# Patient Record
Sex: Female | Born: 1955 | Race: White | Hispanic: No | Marital: Single | Smoking: Never smoker
Health system: Southern US, Community
[De-identification: ages and names within clinical notes are randomized; demographics above are authoritative.]

## PROBLEM LIST (undated history)

## (undated) DIAGNOSIS — B019 Varicella without complication: Secondary | ICD-10-CM

## (undated) HISTORY — PX: FRACTURE SURGERY: SHX138

## (undated) HISTORY — DX: Varicella without complication: B01.9

---

## 2012-06-10 ENCOUNTER — Encounter (HOSPITAL_COMMUNITY): Payer: Self-pay

## 2012-06-10 ENCOUNTER — Emergency Department (INDEPENDENT_AMBULATORY_CARE_PROVIDER_SITE_OTHER)
Admission: EM | Admit: 2012-06-10 | Discharge: 2012-06-10 | Disposition: A | Discharge status: Home / Self Care | Payer: Self-pay | Source: Home / Self Care

## 2012-06-10 DIAGNOSIS — K029 Dental caries, unspecified: Secondary | ICD-10-CM

## 2012-06-10 MED ORDER — CETYLPYRIDINIUM CHLORIDE 0.05 % MT LIQD: 5.0000 mL | Freq: Four times a day (QID) | OROMUCOSAL | Status: DC

## 2012-06-10 MED ORDER — IBUPROFEN 200 MG PO TABS: 400.0000 mg | ORAL_TABLET | Freq: Four times a day (QID) | ORAL | Status: DC | PRN

## 2012-06-10 NOTE — ED Provider Notes (Signed)
History     CSN: [id number]  Arrival date & time 06/10/12  1014   First MD Initiated Contact with Patient 06/10/12 1159      Chief Complaint  Patient presents with  . 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 She informs the pain to be off and on for past 2 days. She denies any fever or chills. She denies radiation of the pain to the jaw or to the ear. She denies any gum bleeding. She denies any headache, chest pain, palpitation, shortness of breath, abdominal pain, nausea or vomiting. Denies bowel or urinary symptoms. Has not taken any medication for the pain.  History reviewed. No pertinent past medical history.  History reviewed. No pertinent past surgical history.  No family history on file.  History  Substance Use Topics  . Smoking status: Never Smoker   . Smokeless tobacco: Not on file  . Alcohol Use: No    OB History    Grav Para Term Preterm Abortions TAB SAB Ect Mult Living                  Review of Systems As outlined in history of present illness Allergies  Review of patient's allergies indicates no known allergies.  Home Medications  No current outpatient prescriptions on file.  BP 118/83  Pulse 60  Temp 97.5 F (36.4 C) (Oral)  Resp 19  SpO2 99%  Physical Exam Middle-aged female in no distress HEENT: No pallor, moist oral mucosa, Katie the right upper molar tooth, no gum swelling or abscess formation. No jaw tenderness. Chest: Clear to status bilaterally CVS: Normal S1 and S2 no murmurs Abdomen: Soft, nontender Extremities: Warm, no edema ED Course  Procedures (including critical care time)  Labs Reviewed - No data to display No results found.   No diagnosis found.  Dental caries Symptom limited to right upper molar tooth without any abscess or spread of the infection. I will provide her with chlorhexidine oral rinse and also advised her rinse her mouth with  lukewarm Water and salt 3-4 times a day. -Will prescribe her with some Advil for pain. -Counseled her on using fluoridated toothpaste and avoiding sugary foods. If symptoms unimproved or worsen she needs to see a dentist.    MDM  Follow up as needed        Eddie North, MD 06/10/12 1230

## 2012-06-10 NOTE — ED Notes (Signed)
C/o intermitent pain to a tooth on the upper right side of mouth

## 2013-04-30 ENCOUNTER — Encounter (HOSPITAL_COMMUNITY): Payer: Self-pay | Admitting: Emergency Medicine

## 2013-04-30 ENCOUNTER — Emergency Department (HOSPITAL_COMMUNITY): Payer: Self-pay

## 2013-04-30 ENCOUNTER — Emergency Department (HOSPITAL_COMMUNITY)
Admission: EM | Admit: 2013-04-30 | Discharge: 2013-04-30 | Disposition: A | Discharge status: Home / Self Care | Payer: Self-pay | Attending: Emergency Medicine | Admitting: Emergency Medicine

## 2013-04-30 DIAGNOSIS — S0083XA Contusion of other part of head, initial encounter: Secondary | ICD-10-CM

## 2013-04-30 DIAGNOSIS — Y9389 Activity, other specified: Secondary | ICD-10-CM | POA: Insufficient documentation

## 2013-04-30 DIAGNOSIS — S0003XA Contusion of scalp, initial encounter: Secondary | ICD-10-CM | POA: Insufficient documentation

## 2013-04-30 DIAGNOSIS — Y92410 Unspecified street and highway as the place of occurrence of the external cause: Secondary | ICD-10-CM | POA: Insufficient documentation

## 2013-04-30 DIAGNOSIS — S82899A Other fracture of unspecified lower leg, initial encounter for closed fracture: Secondary | ICD-10-CM | POA: Insufficient documentation

## 2013-04-30 DIAGNOSIS — S82839A Other fracture of upper and lower end of unspecified fibula, initial encounter for closed fracture: Secondary | ICD-10-CM

## 2013-04-30 DIAGNOSIS — Z7982 Long term (current) use of aspirin: Secondary | ICD-10-CM | POA: Insufficient documentation

## 2013-04-30 MED ORDER — HYDROCODONE-ACETAMINOPHEN 5-325 MG PO TABS: 1.0000 | ORAL_TABLET | Freq: Four times a day (QID) | ORAL | Status: DC | PRN

## 2013-04-30 MED ORDER — IBUPROFEN 800 MG PO TABS
800.0000 mg | ORAL_TABLET | Freq: Once | ORAL | Status: AC
  Administered 2013-04-30: 800 mg via ORAL
  Filled 2013-04-30: qty 1

## 2013-04-30 NOTE — ED Notes (Signed)
Ortho tech, Jonny Ruiz, notified of orders
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
## 2013-04-30 NOTE — ED Notes (Signed)
Patient observed ambulating to restroom without assistance.

## 2013-04-30 NOTE — ED Notes (Signed)
Ortho tech at bedside 
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
## 2013-05-05 NOTE — ED Provider Notes (Signed)
Medical screening examination/treatment/procedure(s) were performed by non-physician practitioner and as supervising physician I was immediately available for consultation/collaboration.  EKG Interpretation   None        Raeford Razor, MD 05/05/13 [phone]

## 2015-11-20 ENCOUNTER — Telehealth: Payer: Self-pay | Admitting: *Deleted

## 2015-11-20 NOTE — Telephone Encounter (Signed)
Unable to reach patient at time of pre-visit call. Phone # on file has been disconnected or is no longer in service.

## 2015-11-21 ENCOUNTER — Telehealth: Payer: Self-pay | Admitting: Family Medicine

## 2015-11-21 ENCOUNTER — Ambulatory Visit: Payer: Self-pay | Admitting: Family Medicine

## 2015-11-23 ENCOUNTER — Encounter: Payer: Self-pay | Admitting: Family Medicine

## 2015-11-23 NOTE — Telephone Encounter (Signed)
Pt was no show 11/21/15 for new pt appt, 1st no show, no cancellations prior, charge or no charge? Reschedule with you if pt calls?

## 2015-11-23 NOTE — Telephone Encounter (Signed)
No charge but send letter please

## 2015-11-23 NOTE — Telephone Encounter (Signed)
Waiving fee & sending reminder letter

## 2016-01-04 ENCOUNTER — Telehealth: Payer: Self-pay

## 2016-01-04 NOTE — Telephone Encounter (Signed)
Phone number not valid

## 2016-01-07 ENCOUNTER — Encounter: Payer: Self-pay | Admitting: Family Medicine

## 2016-01-07 ENCOUNTER — Ambulatory Visit (INDEPENDENT_AMBULATORY_CARE_PROVIDER_SITE_OTHER): Payer: Self-pay | Admitting: Family Medicine

## 2016-01-07 VITALS — BP 87/71 | HR 61 | Temp 97.7°F | Ht 67.5 in | Wt 121.6 lb

## 2016-01-07 DIAGNOSIS — Z131 Encounter for screening for diabetes mellitus: Secondary | ICD-10-CM

## 2016-01-07 DIAGNOSIS — Z1329 Encounter for screening for other suspected endocrine disorder: Secondary | ICD-10-CM

## 2016-01-07 DIAGNOSIS — Z13220 Encounter for screening for lipoid disorders: Secondary | ICD-10-CM

## 2016-01-07 DIAGNOSIS — F320 Major depressive disorder, single episode, mild: Secondary | ICD-10-CM

## 2016-01-07 DIAGNOSIS — H6123 Impacted cerumen, bilateral: Secondary | ICD-10-CM

## 2016-01-07 DIAGNOSIS — Z130 Encounter for screening for diseases of the blood and blood-forming organs and certain disorders involving the immune mechanism: Secondary | ICD-10-CM

## 2016-01-07 MED ORDER — BUPROPION HCL ER (SR) 150 MG PO TB12
150.0000 mg | ORAL_TABLET | Freq: Two times a day (BID) | ORAL | Status: DC
Start: 2016-01-07 — End: 2022-05-12

## 2016-01-07 NOTE — Progress Notes (Signed)
[facility] [address], [address] [place], [place] [address] [phone] [phone]  Date:  01/07/2016   Name:  Denise PinkMary L Shannon   DOB:  06/01/1956   MRN:  [id number]  PCP:  Default, Provider, MD    Chief Complaint: Establish Care   History of Present Illness:  Denise Shannon is a 60 y.o. very pleasant female patient who presents with the following:  Here today as a new patient to discuss anxiety. She is a new patient to us today. Her mother passed away this spring which has been tough for her.  She states that she is "still thinking about her" more than she thinks is normal.  She still feels sad a lot of the time.  She would be interested in trying medication for depression  She is generally in good health.   She would like to have a general exam and labs as well.  She has noted some difficulty with orgasm as of late.  Her partner has complained about this, and she wonders if anything else could be done to help her with this.   She has been menopausal for a few years.  She has minimal dryness and vaginal discomfort  She is sleeping well, her appetite is good. She has not lost weight. Noted that her BP is low- she does not really see doctors and does not know if this is baseline for her. However she has not noted any sx of lightheadedness or dizziness     No SI Notes that her ears feel clogged, she wonders if she may have excessive ear wax  There are no active problems to display for this patient.   Past Medical History  Diagnosis Date  . Chicken pox     Past Surgical History  Procedure Laterality Date  . Fracture surgery Left     hand    Social History  Substance Use Topics  . Smoking status: Never Smoker   . Smokeless tobacco: Not on file  . Alcohol Use: No    Family History  Problem Relation Age of Onset  . Lung cancer Mother   . Prostate cancer Father   . Stroke Mother     No Known Allergies  Medication list has  been reviewed and updated.  Current Outpatient Prescriptions on File Prior to Visit  Medication Sig Dispense Refill  . aspirin 325 MG tablet Take 325 mg by mouth as needed. Reported on 01/07/2016     No current facility-administered medications on file prior to visit.    Review of Systems:  As per HPI- otherwise negative.   Physical Examination: Filed Vitals:   01/07/16 1427  BP: 87/71  Pulse: 61  Temp: 97.7 F (36.5 C)   Filed Vitals:   01/07/16 1427  Height: 5' 7.5" (1.715 m)  Weight: 121 lb 9.6 oz (55.157 kg)   Body mass index is 18.75 kg/(m^2). Ideal Body Weight: Weight in (lb) to have BMI = 25: 161.7  GEN: WDWN, NAD, Non-toxic, A & O x 3, thin build, looks well HEENT: Atraumatic, Normocephalic. Neck supple. No masses, No LAD.  PEERL, EOMI Ears and Nose: No external deformity. CV: RRR, No M/G/R. No JVD. No thrill. No extra heart sounds. PULM: CTA B, no wheezes, crackles, rhonchi. No retractions. No resp. distress. No accessory muscle use. ABD: S, NT, ND EXTR: No c/c/e NEURO Normal gait.  PSYCH: Normally interactive. Conversant. Not depressed or anxious appearing.  Calm  demeanor.  Bilateral cerumen impaction. Worked on this and removed most of the wax- stopped as she was feeling dizzy   Assessment and Plan: Depression, major, single episode, mild (HCC) - Plan: buPROPion (WELLBUTRIN SR) 150 MG 12 hr tablet  Screening for hyperlipidemia - Plan: Lipid panel  Screening for thyroid disorder - Plan: CMP14+CBC/D/Plt+TSH  Screening for diabetes mellitus - Plan: CMP14+CBC/D/Plt+TSH  Screening for deficiency anemia - Plan: CMP14+CBC/D/Plt+TSH  Cerumen impaction, bilateral  Start on wellbutrin for depression- 150 for one week, then BID Labs pending as above Earwax removed with irrigation She will see me in 3 months for a CPE- will recheck her BP them.  Suspect hypotension is physiologic   Signed Abbe Amsterdam, MD

## 2016-01-07 NOTE — Patient Instructions (Addendum)
It was good to see you today- I will be in touch with your labs asap Start on the wellbutrin once a day, and increase to twice a day after 1 week.   Please see Denise Shannon in 3 months or so for a physical   You might try some OTC ear wax removing drops like Debrox to help keep your earwax soft so it does not get stuck in your eat

## 2016-01-07 NOTE — Progress Notes (Signed)
Pre visit review using our clinic review tool, if applicable. No additional management support is needed unless otherwise documented below in the visit note. 

## 2016-01-08 LAB — LIPID PANEL
Cholesterol: 222 mg/dL — ABNORMAL HIGH (ref 0–200)
HDL: 76.1 mg/dL (ref >39.00)
LDL Cholesterol: 132 mg/dL — ABNORMAL HIGH (ref 0–99)
NONHDL: 145.58
Total CHOL/HDL Ratio: 3
Triglycerides: 68 mg/dL (ref 0.0–149.0)
VLDL: 13.6 mg/dL (ref 0.0–40.0)

## 2016-01-09 ENCOUNTER — Encounter: Payer: Self-pay | Admitting: Family Medicine

## 2016-01-09 LAB — CBC AND DIFFERENTIAL
HEMATOCRIT: 41 % (ref 36–46)
HEMOGLOBIN: 13.4 g/dL (ref 12.0–16.0)
Platelets: 293 10*3/uL (ref 150–399)
WBC: 8.6 10^3/mL

## 2016-01-09 LAB — HEPATIC FUNCTION PANEL
ALK PHOS: 76 U/L (ref 25–125)
ALT: 9 U/L (ref 7–35)
AST: 14 U/L (ref 13–35)
BILIRUBIN, TOTAL: 0.7 mg/dL

## 2016-01-09 LAB — TSH: TSH: 0.74 u[IU]/mL (ref 0.41–5.90)

## 2016-01-09 LAB — BASIC METABOLIC PANEL
BUN: 15 mg/dL (ref 4–21)
Creatinine: 0.8 mg/dL (ref 0.5–1.1)
Glucose: 90 mg/dL
Potassium: 4.2 mmol/L (ref 3.4–5.3)
SODIUM: 142 mmol/L (ref 137–147)

## 2016-01-10 ENCOUNTER — Other Ambulatory Visit: Payer: Self-pay

## 2016-01-16 ENCOUNTER — Encounter: Payer: Self-pay | Admitting: Family Medicine

## 2017-09-14 ENCOUNTER — Encounter: Payer: Self-pay | Admitting: Family Medicine

## 2017-09-14 ENCOUNTER — Ambulatory Visit (INDEPENDENT_AMBULATORY_CARE_PROVIDER_SITE_OTHER): Payer: Self-pay | Admitting: Family Medicine

## 2017-09-14 VITALS — BP 110/78 | HR 62 | Temp 98.0°F | Ht 67.5 in | Wt 136.1 lb

## 2017-09-14 DIAGNOSIS — R040 Epistaxis: Secondary | ICD-10-CM

## 2017-09-14 NOTE — Progress Notes (Signed)
Pre visit review using our clinic review tool, if applicable. No additional management support is needed unless otherwise documented below in the visit note. 

## 2017-09-14 NOTE — Progress Notes (Signed)
Chief Complaint  Patient presents with  . Epistaxis    Subjective: Patient is a 62 y.o. female here for nosebleeds.  Over past mo, has been having more nosebleeds on R side. Usually after sneezing, lasting around 8 minutes. Intermittent use of air humidifier.  She sometimes picks her nose.  No personal or family history of bleeding disorders, no other areas of easy bruising or bleeding.  Although aspirin is on her medication list, she has not taken it recently and only uses as needed for headaches.  She has not tried anything else.   ROS: HEENT: +nosebleeds   Past Medical History:  Diagnosis Date  . Chicken pox    Objective: BP 110/78 (BP Location: Left Arm, Patient Position: Sitting, Cuff Size: Normal)   Pulse 62   Temp 98 F (36.7 C) (Oral)   Ht 5' 7.5" (1.715 m)   Wt 136 lb 2 oz (61.7 kg)   SpO2 98%   BMI 21.01 kg/m  General: Awake, appears stated age HEENT: MMM, L nare neg, R nare patent, excoriation noted over septum without active oozing Lungs: No accessory muscle use Psych: Normal affect and mood  Assessment and Plan: Epistaxis  Apply triple antibiotic ointment (like Neosporin) twice daily on the inside of the nose. You can use Vaseline in between applications of the antibiotic ointment. Run the air humidifier at night and while you are home. The patient voiced understanding and agreement to the plan.  Jilda Rocheicholas Paul VikingWendling, DO 09/14/17  12:24 PM

## 2017-09-14 NOTE — Patient Instructions (Addendum)
Apply triple antibiotic ointment (like Neosporin) twice daily on the inside of the nose. You have to go a little higher. You can use Vaseline in between applications of the antibiotic ointment.  Run the air humidifier at night and while you are home.  Try not to pick. Nosebleed A nosebleed is when blood comes out of the nose. Nosebleeds are common. They are usually not a sign of a serious medical problem. Follow these instructions at home: When you have a nosebleed:  Sit down.  Tilt your head a little forward.  Follow these steps: 1. Pinch your nose with a clean towel or tissue. 2. Keep pinching your nose for 10 minutes. Do not let go. 3. After 10 minutes, let go of your nose. 4. If there is still bleeding, do these steps again. Keep doing these steps until the bleeding stops.  Do not put things in your nose to stop the bleeding.  Try not to lie down or put your head back.  Use a nose spray decongestant as told by your doctor.  Do not use petroleum jelly or mineral oil in your nose. These things can get into your lungs. After a nosebleed:  Try not to blow your nose or sniffle for several hours.  Try not to strain, lift, or bend at the waist for several days.  Use saline spray or a humidifier as told by your doctor.  Aspirin and blood-thinning medicines make bleeding more likely. If you take these medicines, ask your doctor if you should stop taking them, or if you should change how much you take. Do not stop taking the medicine unless your doctor tells you to. Contact a doctor if:  You have a fever.  You get nosebleeds often.  You are getting nosebleeds more often than usual.  You bruise very easily.  You have something stuck in your nose.  You have bleeding in your mouth.  You throw up (vomit) or cough up brown material.  You get a nosebleed after you start a new medicine. Get help right away if:  You have a nosebleed after you fall or hurt your head.  Your  nosebleed does not go away after 20 minutes.  You feel dizzy or weak.  You have unusual bleeding from other parts of your body.  You have unusual bruising on other parts of your body.  You get sweaty.  You throw up blood. Summary  Nosebleeds are common. They are usually not a sign of a serious medical problem.  When you have a nosebleed, sit down and tilt your head a little forward. Pinch your nose with a clean tissue.  After the bleeding stops, try not to blow your nose or sniffle for several hours. This information is not intended to replace advice given to you by your health care provider. Make sure you discuss any questions you have with your health care provider. Document Released: 03/25/2008 Document Revised: 09/26/2016 Document Reviewed: 09/26/2016 Elsevier Interactive Patient Education  2018 [employer]

## 2018-04-10 NOTE — Progress Notes (Addendum)
[facility] at [facility] [address] [place], [place] [address] [phone]  Date:  04/14/2018   Name:  Denise Shannon   DOB:  April 07, 1956   MRN:  [id number]  PCP:  Default, Provider, MD    Chief Complaint: Bloated (4-6 months,  lbm yestrday, no blood in stool) and Shoulder Pain (3 months, right shoulder, no known injury, trouble lifting)   History of Present Illness:  Denise Shannon is a 62 y.o. very pleasant female patient who presents with the following:  Last seen by myself as a new patient in July of 2017, but was in to see Dr. Carmelia Roller earlier this year due to nosebleeds.  She did not come back to see me for CPE in 2017 as we had discussed  Here today with concern of bloating in her lower abd and right shoulder pain   She has had noted right shoulder pain for 3 months or so She is not aware of any injury  Her shoulder has seemed about the same for the last several months.  She does have difficulty with her ROM  She will have some off and on pain Lifting makes it hurt more She has not tried any medication for her shoulder as of yet  Also, she has noted a feeling of fullness/ bloating in her lower abd for 3 months or so She has been constipated "on and off" Her pants don't seem to fit quite like they should  She is not aware of any personal or family GYN cancer  Never had a colonoscopybl Last pap was about 3 years ago- at least   Wt Readings from Last 3 Encounters:  04/14/18 138 lb (62.6 kg)  09/14/17 136 lb 2 oz (61.7 kg)  01/07/16 121 lb 9.6 oz (55.2 kg)    There are no active problems to display for this patient.   Past Medical History:  Diagnosis Date  . Chicken pox     Past Surgical History:  Procedure Laterality Date  . FRACTURE SURGERY Left    hand    Social History   Tobacco Use  . Smoking status: Never Smoker  . Smokeless tobacco: Never Used  Substance Use Topics  . Alcohol use: No  . Drug use:  Not on file    Family History  Problem Relation Age of Onset  . Lung cancer Mother   . Prostate cancer Father   . Stroke Mother     No Known Allergies  Medication list has been reviewed and updated.  Current Outpatient Medications on File Prior to Visit  Medication Sig Dispense Refill  . aspirin 325 MG tablet Take 325 mg by mouth as needed. Reported on 01/07/2016    . buPROPion (WELLBUTRIN SR) 150 MG 12 hr tablet Take 1 tablet (150 mg total) by mouth 2 (two) times daily. (Patient not taking: Reported on 04/14/2018) 60 tablet 5   No current facility-administered medications on file prior to visit.     Review of Systems:  As per HPI- otherwise negative. No fever or chills No CP or SOB    Physical Examination: Vitals:   04/14/18 1017  BP: 110/60  Pulse: 64  Resp: 16  Temp: 97.9 F (36.6 C)  SpO2: 98%   Vitals:   04/14/18 1017  Weight: 138 lb (62.6 kg)  Height: 5' 7.5" (1.715 m)   Body mass index is 21.29 kg/m. Ideal Body Weight: Weight in (lb) to have  BMI = 25: 161.7  GEN: WDWN, NAD, Non-toxic, A & O x 3, normal weight HEENT: Atraumatic, Normocephalic. Neck supple. No masses, No LAD.  Bilateral TM wnl, oropharynx normal.  PEERL,EOMI.   Ears and Nose: No external deformity. CV: RRR, No M/G/R. No JVD. No thrill. No extra heart sounds. PULM: CTA B, no wheezes, crackles, rhonchi. No retractions. No resp. distress. No accessory muscle use. ABD: S, NT, ND, +BS. No rebound. No HSM. No masses palpated in belly or pelvis on exam  EXTR: No c/c/e NEURO Normal gait.  PSYCH: Normally interactive. Conversant. Not depressed or anxious appearing.  Calm demeanor.  Right shoulder:  She can flex and abduct only to about 90 degrees.  Internal rotation and external rotation are also significantly restricted.  BUE display normal strength otherwise   Assessment and Plan: Strain of right shoulder, initial encounter - Plan: Ambulatory referral to Orthopedic Surgery  Decreased right  shoulder range of motion - Plan: Ambulatory referral to Orthopedic Surgery  Abdominal bloating - Plan: CA 125, US Pelvic Complete With Transvaginal  Encounter for hepatitis C screening test for low risk patient - Plan: Hepatitis C antibody  Screening for diabetes mellitus - Plan: Comprehensive metabolic panel, Hemoglobin A1c  Screening for hyperlipidemia - Plan: Lipid panel  Screening for deficiency anemia - Plan: CBC  Right shoulder pain and reduced ROM- significant.  Referral to ortho Pt has noted feeling of abd/ pelvic bloating.  Will run labs as above, including Ca-125, and also obtain pelvic US for her encouraged her to come in for a CPE soon She will plan to get her flu shot at drug store as it is cheaper for her   Signed Abbe Amsterdam, MD  Received her labs, letter to pt  Results for orders placed or performed in visit on 04/14/18  CA 125  Result Value Ref Range   CA 125 19 <35 U/mL  CBC  Result Value Ref Range   WBC 5.9 4.0 - 10.5 K/uL   RBC 4.28 3.87 - 5.11 Mil/uL   Platelets 307.0 150.0 - 400.0 K/uL   Hemoglobin 12.0 12.0 - 15.0 g/dL   HCT 10.2 72.5 - 36.6 %   MCV 85.4 78.0 - 100.0 fl   MCHC 32.8 30.0 - 36.0 g/dL   RDW 44.0 (H) 34.7 - 42.5 %  Comprehensive metabolic panel  Result Value Ref Range   Sodium 140 135 - 145 mEq/L   Potassium 4.0 3.5 - 5.1 mEq/L   Chloride 105 96 - 112 mEq/L   CO2 29 19 - 32 mEq/L   Glucose, Bld 81 70 - 99 mg/dL   BUN 15 6 - 23 mg/dL   Creatinine, Ser 9.56 0.40 - 1.20 mg/dL   Total Bilirubin 0.7 0.2 - 1.2 mg/dL   Alkaline Phosphatase 69 39 - 117 U/L   AST 11 0 - 37 U/L   ALT 8 0 - 35 U/L   Total Protein 6.4 6.0 - 8.3 g/dL   Albumin 4.0 3.5 - 5.2 g/dL   Calcium 9.3 8.4 - 38.7 mg/dL   GFR 56.43 >32.95 mL/min  Hemoglobin A1c  Result Value Ref Range   Hgb A1c MFr Bld 5.5 4.6 - 6.5 %  Lipid panel  Result Value Ref Range   Cholesterol 208 (H) 0 - 200 mg/dL   Triglycerides 18.8 0.0 - 149.0 mg/dL   HDL 41.66 >06.30 mg/dL    VLDL 16.0 0.0 - 10.9 mg/dL   LDL Cholesterol 323 (H) 0 - 99 mg/dL  Total CHOL/HDL Ratio 3    NonHDL 139.55   Hepatitis C antibody  Result Value Ref Range   Hepatitis C Ab NON-REACTIVE NON-REACTI   SIGNAL TO CUT-OFF 0.02 <1.00

## 2018-04-14 ENCOUNTER — Encounter: Payer: Self-pay | Admitting: Family Medicine

## 2018-04-14 ENCOUNTER — Ambulatory Visit (INDEPENDENT_AMBULATORY_CARE_PROVIDER_SITE_OTHER): Payer: Self-pay | Admitting: Family Medicine

## 2018-04-14 VITALS — BP 110/60 | HR 64 | Temp 97.9°F | Resp 16 | Ht 67.5 in | Wt 138.0 lb

## 2018-04-14 DIAGNOSIS — S46911A Strain of unspecified muscle, fascia and tendon at shoulder and upper arm level, right arm, initial encounter: Secondary | ICD-10-CM

## 2018-04-14 DIAGNOSIS — M25611 Stiffness of right shoulder, not elsewhere classified: Secondary | ICD-10-CM

## 2018-04-14 DIAGNOSIS — Z1159 Encounter for screening for other viral diseases: Secondary | ICD-10-CM

## 2018-04-14 DIAGNOSIS — Z13220 Encounter for screening for lipoid disorders: Secondary | ICD-10-CM

## 2018-04-14 DIAGNOSIS — Z131 Encounter for screening for diabetes mellitus: Secondary | ICD-10-CM

## 2018-04-14 DIAGNOSIS — Z130 Encounter for screening for diseases of the blood and blood-forming organs and certain disorders involving the immune mechanism: Secondary | ICD-10-CM

## 2018-04-14 DIAGNOSIS — R140 Abdominal distension (gaseous): Secondary | ICD-10-CM

## 2018-04-14 LAB — COMPREHENSIVE METABOLIC PANEL
ALT: 8 U/L (ref 0–35)
AST: 11 U/L (ref 0–37)
Albumin: 4 g/dL (ref 3.5–5.2)
Alkaline Phosphatase: 69 U/L (ref 39–117)
BILIRUBIN TOTAL: 0.7 mg/dL (ref 0.2–1.2)
BUN: 15 mg/dL (ref 6–23)
CALCIUM: 9.3 mg/dL (ref 8.4–10.5)
CO2: 29 meq/L (ref 19–32)
Chloride: 105 mEq/L (ref 96–112)
Creatinine, Ser: 0.83 mg/dL (ref 0.40–1.20)
GFR: 74.25 mL/min (ref >60.00)
Glucose, Bld: 81 mg/dL (ref 70–99)
Potassium: 4 mEq/L (ref 3.5–5.1)
Sodium: 140 mEq/L (ref 135–145)
Total Protein: 6.4 g/dL (ref 6.0–8.3)

## 2018-04-14 LAB — CBC
HEMATOCRIT: 36.5 % (ref 36.0–46.0)
HEMOGLOBIN: 12 g/dL (ref 12.0–15.0)
MCHC: 32.8 g/dL (ref 30.0–36.0)
MCV: 85.4 fl (ref 78.0–100.0)
PLATELETS: 307 10*3/uL (ref 150.0–400.0)
RBC: 4.28 Mil/uL (ref 3.87–5.11)
RDW: 16.4 % — ABNORMAL HIGH (ref 11.5–15.5)
WBC: 5.9 10*3/uL (ref 4.0–10.5)

## 2018-04-14 LAB — LIPID PANEL
CHOLESTEROL: 208 mg/dL — AB (ref 0–200)
HDL: 68.8 mg/dL (ref >39.00)
LDL CALC: 125 mg/dL — AB (ref 0–99)
NonHDL: 139.55
TRIGLYCERIDES: 72 mg/dL (ref 0.0–149.0)
Total CHOL/HDL Ratio: 3
VLDL: 14.4 mg/dL (ref 0.0–40.0)

## 2018-04-14 LAB — HEMOGLOBIN A1C: Hgb A1c MFr Bld: 5.5 % (ref 4.6–6.5)

## 2018-04-14 NOTE — Patient Instructions (Signed)
Good to see you today- I will be in touch with your labs asap We are going to have your see an orthopedist about your shoulder Also, I will arrange an ultrasound to check your ovaries and uterus due to your feeling of bloating You are overdue for a physical and pap- please schedule a complete physical with me in the next few weeks  Let me know if you don't hear about your ultrasound and orthopedist appointments soon!

## 2018-04-15 LAB — CA 125: CA 125: 19 U/mL (ref <35)

## 2018-04-15 LAB — HEPATITIS C ANTIBODY
HEP C AB: NONREACTIVE
SIGNAL TO CUT-OFF: 0.02 (ref <1.00)

## 2018-05-12 ENCOUNTER — Encounter: Payer: Self-pay | Admitting: Family Medicine

## 2018-05-19 ENCOUNTER — Telehealth: Payer: Self-pay | Admitting: *Deleted

## 2018-05-19 NOTE — Telephone Encounter (Signed)
Received request for Medical Records from [employer]; forwarded to Medical Records via email/scan/SLS  

## 2020-09-19 NOTE — Progress Notes (Deleted)
[facility] at [facility] [address] [place], [place] [address] [phone]  Date:  09/20/2020   Name:  Denise Shannon   DOB:  03-28-1956   MRN:  [id number]  PCP:  Pearline Cables, MD    Chief Complaint: No chief complaint on file.   History of Present Illness:  Denise Shannon is a 65 y.o. very pleasant female patient who presents with the following:  Virtual visit today for pt with concern of illness I have not seen her since 2019 Pt location is home, my location is office Pt ID confirmed with 2 factors, she gives consent for virtual visit today     There are no problems to display for this patient.   Past Medical History:  Diagnosis Date  . Chicken pox     Past Surgical History:  Procedure Laterality Date  . FRACTURE SURGERY Left    hand    Social History   Tobacco Use  . Smoking status: Never Smoker  . Smokeless tobacco: Never Used  Substance Use Topics  . Alcohol use: No    Family History  Problem Relation Age of Onset  . Lung cancer Mother   . Prostate cancer Father   . Stroke Mother     No Known Allergies  Medication list has been reviewed and updated.  Current Outpatient Medications on File Prior to Visit  Medication Sig Dispense Refill  . aspirin 325 MG tablet Take 325 mg by mouth as needed. Reported on 01/07/2016    . buPROPion (WELLBUTRIN SR) 150 MG 12 hr tablet Take 1 tablet (150 mg total) by mouth 2 (two) times daily. (Patient not taking: Reported on 04/14/2018) 60 tablet 5   No current facility-administered medications on file prior to visit.    Review of Systems:  As per HPI- otherwise negative.   Physical Examination: There were no vitals filed for this visit. There were no vitals filed for this visit. There is no height or weight on file to calculate BMI. Ideal Body Weight:      Assessment and Plan: ***  Signed Abbe Amsterdam, MD

## 2020-09-20 ENCOUNTER — Other Ambulatory Visit: Payer: Self-pay

## 2020-09-20 ENCOUNTER — Telehealth: Payer: Self-pay | Admitting: Family Medicine

## 2020-09-21 ENCOUNTER — Other Ambulatory Visit: Payer: Self-pay

## 2020-09-21 ENCOUNTER — Encounter: Payer: Self-pay | Admitting: Family Medicine

## 2020-09-21 ENCOUNTER — Telehealth (INDEPENDENT_AMBULATORY_CARE_PROVIDER_SITE_OTHER): Payer: Self-pay | Admitting: Family Medicine

## 2020-09-21 DIAGNOSIS — J069 Acute upper respiratory infection, unspecified: Secondary | ICD-10-CM

## 2020-09-21 NOTE — Progress Notes (Signed)
Chief Complaint  Patient presents with  . Cough    Denise Shannon here for URI complaints. Due to COVID-19 pandemic, we are interacting via telephone. I verified patient's ID using 2 identifiers. Patient agreed to proceed with visit via this method. Patient is at home, I am at office. Patient and I are present for visit.   Duration: 6 days  Associated symptoms: sinus congestion, rhinorrhea, itchy watery eyes, myalgia and cough Denies: sinus pain, ear pain, ear drainage, sore throat, wheezing, shortness of breath and fevers Treatment to date: Theraflu, Robitussin Sick contacts: No  Feels around 70% improved.   Past Medical History:  Diagnosis Date  . Chicken pox    Exam No conversational dyspnea Age appropriate judgment and insight Nml affect and mood  Viral URI with cough  Cont supportive care, she is improving.  Continue to push fluids, practice good hand hygiene, cover mouth when coughing. F/u prn. If starting to experience fevers, shaking, or shortness of breath, seek immediate care. Total time: 7 min Pt voiced understanding and agreement to the plan.  Jilda Roche [place], DO 09/21/20 1:45 PM

## 2020-11-30 NOTE — Progress Notes (Addendum)
[facility] at [facility] [place] [address] [place], [place] [address] [phone] Fax [phone]  Date:  12/03/2020   Name:  Denise Shannon   DOB:  07/01/1955   MRN:  [id number]  PCP:  Copland, Jessica C, MD    Chief Complaint: Annual Exam and Rash (Rash upper portion of body, redness, "stings", one week, using otc cream )   History of Present Illness:  Denise Shannon is a 64 y.o. very pleasant female patient who presents with the following:  Patient here today for physical exam Last visit with myself was in 2019 for shoulder pain  She has not tended to keep up with recommended health maintenance or screening  Pap smear- we will do for her today, she is not certain day of last Pap Colon cancer screening- never been screened.  She is not aware of any family history- she would prefer to do a cologuard kit  Mammogram- we will order for her, she states she thinks she had one in the past but I don't have a record  Due for tetanus-does not wish to do today Shingles vaccine COVID-19 vaccine-not done yet, I encouraged her to have COVID-vaccine as soon as possible Most recent lab work in 2019- we will update for her today   She has noted a rash on her chest and arm for about a week- it stings and she is using an OTC medication -topical Benadryl She thinks this rash came from doing work outside in her garden  Wt Readings from Last 3 Encounters:  12/03/20 155 lb (70.3 kg)  04/14/18 138 lb (62.6 kg)  09/14/17 136 lb 2 oz (61.7 kg)   She does try to stay active around the house.  She does not work outside the home  Married to Randall They do not have children    There are no problems to display for this patient.   Past Medical History:  Diagnosis Date  . Chicken pox     Past Surgical History:  Procedure Laterality Date  . FRACTURE SURGERY Left    hand    Social History   Tobacco Use  . Smoking status: Never Smoker  . Smokeless tobacco: Never  Used  Substance Use Topics  . Alcohol use: No    Family History  Problem Relation Age of Onset  . Lung cancer Mother   . Prostate cancer Father   . Stroke Mother     No Known Allergies  Medication list has been reviewed and updated.  Current Outpatient Medications on File Prior to Visit  Medication Sig Dispense Refill  . aspirin 325 MG tablet Take 325 mg by mouth as needed. Reported on 01/07/2016    . buPROPion (WELLBUTRIN SR) 150 MG 12 hr tablet Take 1 tablet (150 mg total) by mouth 2 (two) times daily. (Patient not taking: Reported on 12/03/2020) 60 tablet 5   No current facility-administered medications on file prior to visit.    Review of Systems:  As per HPI- otherwise negative.   Physical Examination: Vitals:   12/03/20 1403  BP: 116/64  Pulse: 74  Resp: 17  Temp: 98 F (36.7 C)  SpO2: 99%   Vitals:   12/03/20 1403  Weight: 155 lb (70.3 kg)  Height: 5' 7.5" (1.715 m)   Body mass index is 23.92 kg/m. Ideal Body Weight: Weight in (lb) to have BMI = 25: 161.7  GEN: no acute distress.  Normal weight, looks well HEENT:   Atraumatic, Normocephalic.  Ears and Nose: No external deformity. CV: RRR, No M/G/R. No JVD. No thrill. No extra heart sounds. PULM: CTA B, no wheezes, crackles, rhonchi. No retractions. No resp. distress. No accessory muscle use. ABD: S, NT, ND, +BS. No rebound. No HSM. EXTR: No c/c/e PSYCH: Normally interactive. Conversant.  She has what looks like contact dermatitis on her chest and right arm.  Not necessarily Rhus dermatitis.  It is not overly severe or widespread Pelvic exam and pap today-she had discomfort with Pap, I was not able to completely visualize her cervix as I did not want to cause pain.  Visualized part of cervix appears normal, vaginal canal normal.  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  Ordered mammogram and Cologuard.  Labs and Pap as above.  Encouraged her to get COVID-19 vaccination Will plan further follow- up pending labs.  Encouraged healthy diet and exercise routine  This visit occurred during the SARS-CoV-2 public health emergency.  Safety protocols were in place, including screening questions prior to the visit, additional usage of staff PPE, and extensive cleaning of exam room while observing appropriate contact time as indicated for disinfecting solutions.    Signed Lamar Blinks, MD  Addendum 6/8, received her labs and Pap-letter to patient     Results for orders placed or performed in visit on 12/03/20  CBC  Result Value Ref Range   WBC 7.6 4.0 - 10.5 K/uL   RBC 4.29 3.87 - 5.11 Mil/uL   Platelets 308.0 150.0 - 400.0 K/uL   Hemoglobin 13.4 12.0 - 15.0 g/dL   HCT 39.3 36.0 - 46.0 %   MCV 91.6 78.0 - 100.0 fl   MCHC 34.0 30.0 - 36.0 g/dL   RDW 14.0 11.5 - 15.5 %  Comprehensive metabolic panel  Result Value Ref Range   Sodium 141 135 - 145 mEq/L   Potassium 3.9 3.5 - 5.1 mEq/L   Chloride 104 96 - 112 mEq/L   CO2 26 19 - 32 mEq/L   Glucose, Bld 86 70 - 99 mg/dL   BUN 13 6 - 23 mg/dL   Creatinine, Ser 0.92 0.40 - 1.20 mg/dL   Total Bilirubin 0.7 0.2 - 1.2 mg/dL   Alkaline Phosphatase 81 39 - 117 U/L   AST 15 0 - 37 U/L   ALT 13 0 - 35 U/L   Total Protein 6.6 6.0 - 8.3 g/dL   Albumin 4.0 3.5 - 5.2 g/dL   GFR 66.15 >60.00 mL/min   Calcium 9.4 8.4 - 10.5 mg/dL  Lipid  panel  Result Value Ref Range   Cholesterol 195 0 - 200 mg/dL   Triglycerides 113.0 0.0 - 149.0 mg/dL   HDL 59.00 >39.00 mg/dL   VLDL 22.6 0.0 - 40.0 mg/dL   LDL Cholesterol 113 (H) 0 - 99 mg/dL   Total CHOL/HDL Ratio 3    NonHDL 135.88   Cytology -  PAP  Result Value Ref Range   High risk HPV Negative    Adequacy      Satisfactory for evaluation; transformation zone component PRESENT.   Diagnosis      - Negative for intraepithelial lesion or malignancy (NILM)   Comment Normal Reference Range HPV - Negative      

## 2020-12-03 ENCOUNTER — Other Ambulatory Visit (HOSPITAL_COMMUNITY)
Admission: RE | Admit: 2020-12-03 | Discharge: 2020-12-03 | Disposition: A | Discharge status: Home / Self Care | Payer: Self-pay | Source: Ambulatory Visit | Attending: Family Medicine | Admitting: Family Medicine

## 2020-12-03 ENCOUNTER — Ambulatory Visit (INDEPENDENT_AMBULATORY_CARE_PROVIDER_SITE_OTHER): Payer: Self-pay | Admitting: Family Medicine

## 2020-12-03 ENCOUNTER — Other Ambulatory Visit: Payer: Self-pay

## 2020-12-03 ENCOUNTER — Other Ambulatory Visit (HOSPITAL_BASED_OUTPATIENT_CLINIC_OR_DEPARTMENT_OTHER): Payer: Self-pay

## 2020-12-03 VITALS — BP 116/64 | HR 74 | Temp 98.0°F | Resp 17 | Ht 67.5 in | Wt 155.0 lb

## 2020-12-03 DIAGNOSIS — Z130 Encounter for screening for diseases of the blood and blood-forming organs and certain disorders involving the immune mechanism: Secondary | ICD-10-CM

## 2020-12-03 DIAGNOSIS — Z124 Encounter for screening for malignant neoplasm of cervix: Secondary | ICD-10-CM

## 2020-12-03 DIAGNOSIS — Z131 Encounter for screening for diabetes mellitus: Secondary | ICD-10-CM

## 2020-12-03 DIAGNOSIS — R21 Rash and other nonspecific skin eruption: Secondary | ICD-10-CM

## 2020-12-03 DIAGNOSIS — Z0000 Encounter for general adult medical examination without abnormal findings: Secondary | ICD-10-CM

## 2020-12-03 DIAGNOSIS — Z13220 Encounter for screening for lipoid disorders: Secondary | ICD-10-CM

## 2020-12-03 DIAGNOSIS — R5383 Other fatigue: Secondary | ICD-10-CM

## 2020-12-03 DIAGNOSIS — Z1231 Encounter for screening mammogram for malignant neoplasm of breast: Secondary | ICD-10-CM

## 2020-12-03 DIAGNOSIS — Z1211 Encounter for screening for malignant neoplasm of colon: Secondary | ICD-10-CM

## 2020-12-03 MED ORDER — TRIAMCINOLONE ACETONIDE 0.5 % EX CREA: 1.0000 "application " | TOPICAL_CREAM | Freq: Two times a day (BID) | CUTANEOUS | 0 refills | Status: DC

## 2020-12-03 NOTE — Patient Instructions (Signed)
It was good to see you again today- I will be in touch with your labs asap I do recommend that you get a mammogram, and the covid 19 shot series asap! We will order a mammogram and a Cologuard kit to screen for colon cancer Please call Cologaurd and make sure you can afford the test before you send your sample back in the mail    Health Maintenance, Female Adopting a healthy lifestyle and getting preventive care are important in promoting health and wellness. Ask your health care provider about:  The right schedule for you to have regular tests and exams.  Things you can do on your own to prevent diseases and keep yourself healthy. What should I know about diet, weight, and exercise? Eat a healthy diet  Eat a diet that includes plenty of vegetables, fruits, low-fat dairy products, and lean protein.  Do not eat a lot of foods that are high in solid fats, added sugars, or sodium.   Maintain a healthy weight Body mass index (BMI) is used to identify weight problems. It estimates body fat based on height and weight. Your health care provider can help determine your BMI and help you achieve or maintain a healthy weight. Get regular exercise Get regular exercise. This is one of the most important things you can do for your health. Most adults should:  Exercise for at least 150 minutes each week. The exercise should increase your heart rate and make you sweat (moderate-intensity exercise).  Do strengthening exercises at least twice a week. This is in addition to the moderate-intensity exercise.  Spend less time sitting. Even light physical activity can be beneficial. Watch cholesterol and blood lipids Have your blood tested for lipids and cholesterol at 64 years of age, then have this test every 5 years. Have your cholesterol levels checked more often if:  Your lipid or cholesterol levels are high.  You are older than 64 years of age.  You are at high risk for heart disease. What should  I know about cancer screening? Depending on your health history and family history, you may need to have cancer screening at various ages. This may include screening for:  Breast cancer.  Cervical cancer.  Colorectal cancer.  Skin cancer.  Lung cancer. What should I know about heart disease, diabetes, and high blood pressure? Blood pressure and heart disease  High blood pressure causes heart disease and increases the risk of stroke. This is more likely to develop in people who have high blood pressure readings, are of African descent, or are overweight.  Have your blood pressure checked: ? Every 3-5 years if you are 51-76 years of age. ? Every year if you are 64 years old or older. Diabetes Have regular diabetes screenings. This checks your fasting blood sugar level. Have the screening done:  Once every three years after age 86 if you are at a normal weight and have a low risk for diabetes.  More often and at a younger age if you are overweight or have a high risk for diabetes. What should I know about preventing infection? Hepatitis B If you have a higher risk for hepatitis B, you should be screened for this virus. Talk with your health care provider to find out if you are at risk for hepatitis B infection. Hepatitis C Testing is recommended for:  Everyone born from [birth year] through 1964.  Anyone with known risk factors for hepatitis C. Sexually transmitted infections (STIs)  Get screened for STIs, including  gonorrhea and chlamydia, if: ? You are sexually active and are younger than 64 years of age. ? You are older than 64 years of age and your health care provider tells you that you are at risk for this type of infection. ? Your sexual activity has changed since you were last screened, and you are at increased risk for chlamydia or gonorrhea. Ask your health care provider if you are at risk.  Ask your health care provider about whether you are at high risk for HIV. Your health  care provider may recommend a prescription medicine to help prevent HIV infection. If you choose to take medicine to prevent HIV, you should first get tested for HIV. You should then be tested every 3 months for as long as you are taking the medicine. Pregnancy  If you are about to stop having your period (premenopausal) and you may become pregnant, seek counseling before you get pregnant.  Take 400 to 800 micrograms (mcg) of folic acid every day if you become pregnant.  Ask for birth control (contraception) if you want to prevent pregnancy. Osteoporosis and menopause Osteoporosis is a disease in which the bones lose minerals and strength with aging. This can result in bone fractures. If you are 71 years old or older, or if you are at risk for osteoporosis and fractures, ask your health care provider if you should:  Be screened for bone loss.  Take a calcium or vitamin D supplement to lower your risk of fractures.  Be given hormone replacement therapy (HRT) to treat symptoms of menopause. Follow these instructions at home: Lifestyle  Do not use any products that contain nicotine or tobacco, such as cigarettes, e-cigarettes, and chewing tobacco. If you need help quitting, ask your health care provider.  Do not use street drugs.  Do not share needles.  Ask your health care provider for help if you need support or information about quitting drugs. Alcohol use  Do not drink alcohol if: ? Your health care provider tells you not to drink. ? You are pregnant, may be pregnant, or are planning to become pregnant.  If you drink alcohol: ? Limit how much you use to 0-1 drink a day. ? Limit intake if you are breastfeeding.  Be aware of how much alcohol is in your drink. In the [place] one drink equals one 12 oz bottle of beer (355 mL), one 5 oz glass of wine (148 mL), or one 1 oz glass of hard liquor (44 mL). General instructions  Schedule regular health, dental, and eye exams.  Stay  current with your vaccines.  Tell your health care provider if: ? You often feel depressed. ? You have ever been abused or do not feel safe at home. Summary  Adopting a healthy lifestyle and getting preventive care are important in promoting health and wellness.  Follow your health care provider's instructions about healthy diet, exercising, and getting tested or screened for diseases.  Follow your health care provider's instructions on monitoring your cholesterol and blood pressure. This information is not intended to replace advice given to you by your health care provider. Make sure you discuss any questions you have with your health care provider. Document Revised: 06/09/2018 Document Reviewed: 06/09/2018 Elsevier Patient Education  2021 [employer].

## 2020-12-04 LAB — COMPREHENSIVE METABOLIC PANEL
ALT: 13 U/L (ref 0–35)
AST: 15 U/L (ref 0–37)
Albumin: 4 g/dL (ref 3.5–5.2)
Alkaline Phosphatase: 81 U/L (ref 39–117)
BUN: 13 mg/dL (ref 6–23)
CO2: 26 mEq/L (ref 19–32)
Calcium: 9.4 mg/dL (ref 8.4–10.5)
Chloride: 104 mEq/L (ref 96–112)
Creatinine, Ser: 0.92 mg/dL (ref 0.40–1.20)
GFR: 66.15 mL/min (ref >60.00)
Glucose, Bld: 86 mg/dL (ref 70–99)
Potassium: 3.9 mEq/L (ref 3.5–5.1)
Sodium: 141 mEq/L (ref 135–145)
Total Bilirubin: 0.7 mg/dL (ref 0.2–1.2)
Total Protein: 6.6 g/dL (ref 6.0–8.3)

## 2020-12-04 LAB — CBC
HCT: 39.3 % (ref 36.0–46.0)
Hemoglobin: 13.4 g/dL (ref 12.0–15.0)
MCHC: 34 g/dL (ref 30.0–36.0)
MCV: 91.6 fl (ref 78.0–100.0)
Platelets: 308 10*3/uL (ref 150.0–400.0)
RBC: 4.29 Mil/uL (ref 3.87–5.11)
RDW: 14 % (ref 11.5–15.5)
WBC: 7.6 10*3/uL (ref 4.0–10.5)

## 2020-12-04 LAB — LIPID PANEL
Cholesterol: 195 mg/dL (ref 0–200)
HDL: 59 mg/dL (ref >39.00)
LDL Cholesterol: 113 mg/dL — ABNORMAL HIGH (ref 0–99)
NonHDL: 135.88
Total CHOL/HDL Ratio: 3
Triglycerides: 113 mg/dL (ref 0.0–149.0)
VLDL: 22.6 mg/dL (ref 0.0–40.0)

## 2020-12-05 LAB — CYTOLOGY - PAP
Comment: NEGATIVE
Diagnosis: NEGATIVE
High risk HPV: NEGATIVE

## 2020-12-13 ENCOUNTER — Telehealth: Payer: Self-pay | Admitting: Family Medicine

## 2020-12-13 NOTE — Telephone Encounter (Signed)
Patient states she has not received cologuard kit

## 2020-12-14 NOTE — Telephone Encounter (Signed)
Cologuard kit has been ordered.

## 2021-01-07 ENCOUNTER — Telehealth: Payer: Self-pay | Admitting: Family Medicine

## 2021-01-07 NOTE — Telephone Encounter (Signed)
Pt states that she has lost her information that was mailed out to her regarding her mammogram. Pt requesting that another copy is mailed to her please.

## 2021-01-08 NOTE — Telephone Encounter (Signed)
Called patient back to get more info on what she is needing. She states she found the form she needed at home and does not need anything from [place] at this time but is thankful.

## 2021-03-08 ENCOUNTER — Ambulatory Visit (INDEPENDENT_AMBULATORY_CARE_PROVIDER_SITE_OTHER): Payer: Self-pay | Admitting: Family

## 2021-03-08 ENCOUNTER — Other Ambulatory Visit (HOSPITAL_BASED_OUTPATIENT_CLINIC_OR_DEPARTMENT_OTHER): Payer: Self-pay

## 2021-03-08 ENCOUNTER — Other Ambulatory Visit: Payer: Self-pay

## 2021-03-08 ENCOUNTER — Encounter: Payer: Self-pay | Admitting: Family

## 2021-03-08 VITALS — BP 116/78 | HR 87 | Temp 97.5°F | Ht 67.5 in | Wt 152.0 lb

## 2021-03-08 DIAGNOSIS — R140 Abdominal distension (gaseous): Secondary | ICD-10-CM

## 2021-03-08 DIAGNOSIS — R300 Dysuria: Secondary | ICD-10-CM

## 2021-03-08 LAB — POCT URINALYSIS DIP (MANUAL ENTRY)
Bilirubin, UA: NEGATIVE
Blood, UA: NEGATIVE
Glucose, UA: NEGATIVE mg/dL
Ketones, POC UA: NEGATIVE mg/dL
Leukocytes, UA: NEGATIVE
Nitrite, UA: NEGATIVE
Protein Ur, POC: NEGATIVE mg/dL
Spec Grav, UA: 1.01 (ref 1.010–1.025)
Urobilinogen, UA: 0.2 E.U./dL
pH, UA: 6 (ref 5.0–8.0)

## 2021-03-08 MED ORDER — CEPHALEXIN 500 MG PO CAPS: 500.0000 mg | ORAL_CAPSULE | Freq: Two times a day (BID) | ORAL | 0 refills | Status: AC

## 2021-03-08 NOTE — Progress Notes (Signed)
Denise Shannon is a 65 y.o. female with the following history as recorded in EpicCare:  There are no problems to display for this patient.   Current Outpatient Medications  Medication Sig Dispense Refill   aspirin 325 MG tablet Take 325 mg by mouth as needed. Reported on 01/07/2016     cephALEXin (KEFLEX) 500 MG capsule Take 1 capsule (500 mg total) by mouth 2 (two) times daily for 5 days. 10 capsule 0   buPROPion (WELLBUTRIN SR) 150 MG 12 hr tablet Take 1 tablet (150 mg total) by mouth 2 (two) times daily. 60 tablet 5   No current facility-administered medications for this visit.    Allergies: Patient has no known allergies.  Past Medical History:  Diagnosis Date   Chicken pox     Past Surgical History:  Procedure Laterality Date   FRACTURE SURGERY Left    hand    Family History  Problem Relation Age of Onset   Lung cancer Mother    Prostate cancer Father    Stroke Mother     Social History   Tobacco Use   Smoking status: Never   Smokeless tobacco: Never  Substance Use Topics   Alcohol use: No    Subjective:  Patient presents with concerns for urinary frequency x 1 year- notes she is urinating up to 6 x per day; no nighttime symptoms; no blood noted in urine; no vaginal bleeding or spotting; no vaginal discharge; not currently sexually active;  Did experience some burning recently but this has improved in the past few days; Mentions concerns about feeling bloated but denies any changes in bowel movements; no nausea or vomiting;       Objective:  Vitals:   03/08/21 1340  BP: 116/78  Pulse: 87  Temp: (!) 97.5 F (36.4 C)  SpO2: 97%  Weight: 152 lb (68.9 kg)  Height: 5' 7.5" (1.715 m)    General: Well developed, well nourished, in no acute distress  Skin : Warm and dry.  Head: Normocephalic and atraumatic  Eyes: Sclera and conjunctiva clear; pupils round and reactive to light; extraocular movements intact  Ears: External normal; canals clear; tympanic  membranes normal  Oropharynx: Pink, supple. No suspicious lesions  Neck: Supple without thyromegaly, adenopathy  Lungs: Respirations unlabored; clear to auscultation bilaterally without wheeze, rales, rhonchi  CVS exam: normal rate and regular rhythm.  Abdomen: Soft; nontender; nondistended; normoactive bowel sounds; no masses or hepatosplenomegaly  Neurologic: Alert and oriented; speech intact; face symmetrical; moves all extremities well; CNII-XII intact without focal deficit  Assessment:  1. Dysuria   2. Bloating symptom     Plan:  Check U/A and urine culture; Rx for Keflex 500 mg bid x 5 days; discussed referral to urology but wants to defer at this time; Discussed imaging such as pelvic ultrasound but patient defers at this time;  This visit occurred during the SARS-CoV-2 public health emergency.  Safety protocols were in place, including screening questions prior to the visit, additional usage of staff PPE, and extensive cleaning of exam room while observing appropriate contact time as indicated for disinfecting solutions.    No follow-ups on file.  Orders Placed This Encounter  Procedures   Urine Culture   POCT urinalysis dipstick    Requested Prescriptions   Signed Prescriptions Disp Refills   cephALEXin (KEFLEX) 500 MG capsule 10 capsule 0    Sig: Take 1 capsule (500 mg total) by mouth 2 (two) times daily for 5 days.

## 2021-03-09 LAB — URINE CULTURE
MICRO NUMBER:: 12353555
Result:: NO GROWTH
SPECIMEN QUALITY:: ADEQUATE

## 2021-08-12 ENCOUNTER — Ambulatory Visit (INDEPENDENT_AMBULATORY_CARE_PROVIDER_SITE_OTHER): Payer: Self-pay | Admitting: Family Medicine

## 2021-08-12 ENCOUNTER — Encounter: Payer: Self-pay | Admitting: Family Medicine

## 2021-08-12 VITALS — BP 95/47 | HR 66 | Ht 67.5 in | Wt 160.0 lb

## 2021-08-12 DIAGNOSIS — M7989 Other specified soft tissue disorders: Secondary | ICD-10-CM

## 2021-08-12 NOTE — Patient Instructions (Signed)
Ultrasound ordered. We will let you know results and plan.

## 2021-08-12 NOTE — Progress Notes (Signed)
Acute Office Visit  Subjective:    Patient ID: Denise Shannon, female    DOB: 1955/07/01, 66 y.o.   MRN: [id number]  Chief Complaint  Patient presents with   Back Pain    HPI Patient is in today for back pain with palpable mass.   Patient states she has been having right lower back pain for the past 4 weeks or so. States she can feel a round lump deep in her right lower back that is painful. Describes the pain as a 6/10 pulling/aching sensation localized to the lump. She cannot put pressure on the area or lie certain ways because of the discomfort. She denies any radiation of pain, rashes, unintentional weight loss, numbness, tingling, incontinence, saddle paresthesias.       Past Medical History:  Diagnosis Date   Chicken pox     Past Surgical History:  Procedure Laterality Date   FRACTURE SURGERY Left    hand    Family History  Problem Relation Age of Onset   Lung cancer Mother    Prostate cancer Father    Stroke Mother     Social History   Socioeconomic History   Marital status: Single    Spouse name: Not on file   Number of children: Not on file   Years of education: Not on file   Highest education level: Not on file  Occupational History   Not on file  Tobacco Use   Smoking status: Never   Smokeless tobacco: Never  Substance and Sexual Activity   Alcohol use: No   Drug use: Not on file   Sexual activity: Not on file  Other Topics Concern   Not on file  Social History Narrative   Not on file   Social Determinants of Health   Financial Resource Strain: Not on file  Food Insecurity: Not on file  Transportation Needs: Not on file  Physical Activity: Not on file  Stress: Not on file  Social Connections: Not on file  Intimate Partner Violence: Not on file    Outpatient Medications Prior to Visit  Medication Sig Dispense Refill   aspirin 325 MG tablet Take 325 mg by mouth as needed. Reported on 01/07/2016     buPROPion (WELLBUTRIN SR) 150 MG 12  hr tablet Take 1 tablet (150 mg total) by mouth 2 (two) times daily. 60 tablet 5   No facility-administered medications prior to visit.    No Known Allergies  Review of Systems All review of systems negative except what is listed in the HPI     Objective:    Physical Exam Vitals reviewed.  Constitutional:      Appearance: Normal appearance.  HENT:     Head: Normocephalic and atraumatic.  Musculoskeletal:        General: Normal range of motion.     Comments: Approximately 2cm round mass located deep in right lower back, firm, mobile, not fixed or matted   Skin:    General: Skin is warm.     Findings: No bruising, erythema or rash.  Neurological:     General: No focal deficit present.     Mental Status: She is alert and oriented to person, place, and time. Mental status is at baseline.  Psychiatric:        Mood and Affect: Mood normal.        Behavior: Behavior normal.        Thought Content: Thought content normal.  Judgment: Judgment normal.    BP (!) 95/47    Pulse 66    Ht 5' 7.5" (1.715 m)    Wt 160 lb (72.6 kg)    BMI 24.69 kg/m  Wt Readings from Last 3 Encounters:  08/12/21 160 lb (72.6 kg)  03/08/21 152 lb (68.9 kg)  12/03/20 155 lb (70.3 kg)    Health Maintenance Due  Topic Date Due   HIV Screening  Never done   COLONOSCOPY (Pts 45-51yrs Insurance coverage will need to be confirmed)  Never done   MAMMOGRAM  Never done   Zoster Vaccines- Shingrix (1 of 2) Never done   TETANUS/TDAP  06/30/2016   INFLUENZA VACCINE  Never done    There are no preventive care reminders to display for this patient.   Lab Results  Component Value Date   TSH 0.74 01/09/2016   Lab Results  Component Value Date   WBC 7.6 12/03/2020   HGB 13.4 12/03/2020   HCT 39.3 12/03/2020   MCV 91.6 12/03/2020   PLT 308.0 12/03/2020   Lab Results  Component Value Date   NA 141 12/03/2020   K 3.9 12/03/2020   CO2 26 12/03/2020   GLUCOSE 86 12/03/2020   BUN 13 12/03/2020    CREATININE 0.92 12/03/2020   BILITOT 0.7 12/03/2020   ALKPHOS 81 12/03/2020   AST 15 12/03/2020   ALT 13 12/03/2020   PROT 6.6 12/03/2020   ALBUMIN 4.0 12/03/2020   CALCIUM 9.4 12/03/2020   GFR 66.15 12/03/2020   Lab Results  Component Value Date   CHOL 195 12/03/2020   Lab Results  Component Value Date   HDL 59.00 12/03/2020   Lab Results  Component Value Date   LDLCALC 113 (H) 12/03/2020   Lab Results  Component Value Date   TRIG 113.0 12/03/2020   Lab Results  Component Value Date   CHOLHDL 3 12/03/2020   Lab Results  Component Value Date   HGBA1C 5.5 04/14/2018       Assessment & Plan:   1. Soft tissue mass Ordering [place] to confirm etiology. Per radiology, order chest soft tissue and document location of lower back. Will update patient with results and plan. Patient aware of signs/symptoms requiring further/urgent evaluation.   - [place] CHEST SOFT TISSUE; Future   Follow-up if symptoms worsen or fail to improve.   Clayborne Dana, NP

## 2021-08-13 ENCOUNTER — Other Ambulatory Visit: Payer: Self-pay

## 2021-08-13 ENCOUNTER — Ambulatory Visit (HOSPITAL_BASED_OUTPATIENT_CLINIC_OR_DEPARTMENT_OTHER)
Admission: RE | Admit: 2021-08-13 | Discharge: 2021-08-13 | Disposition: A | Discharge status: Home / Self Care | Payer: Self-pay | Source: Ambulatory Visit | Attending: Family Medicine | Admitting: Family Medicine

## 2021-08-13 ENCOUNTER — Other Ambulatory Visit: Payer: Self-pay | Admitting: Family Medicine

## 2021-08-13 DIAGNOSIS — M7989 Other specified soft tissue disorders: Secondary | ICD-10-CM | POA: Insufficient documentation

## 2021-08-14 ENCOUNTER — Telehealth: Payer: Self-pay | Admitting: Family Medicine

## 2021-08-14 DIAGNOSIS — M7989 Other specified soft tissue disorders: Secondary | ICD-10-CM

## 2021-08-14 NOTE — Telephone Encounter (Signed)
-----   Message from Juel Burrow, [place] sent at 08/14/2021  9:51 AM EST ----- Pt notified of results and is okay with moving forward with the MRI.

## 2021-08-14 NOTE — Telephone Encounter (Signed)
Patient wants to proceed with MRI. Order placed. 

## 2021-08-17 ENCOUNTER — Other Ambulatory Visit: Payer: Self-pay

## 2021-08-17 ENCOUNTER — Ambulatory Visit (HOSPITAL_BASED_OUTPATIENT_CLINIC_OR_DEPARTMENT_OTHER)
Admission: RE | Admit: 2021-08-17 | Discharge: 2021-08-17 | Disposition: A | Discharge status: Home / Self Care | Payer: Self-pay | Source: Ambulatory Visit | Attending: Family Medicine | Admitting: Family Medicine

## 2021-08-17 DIAGNOSIS — M7989 Other specified soft tissue disorders: Secondary | ICD-10-CM | POA: Insufficient documentation

## 2021-08-17 MED ORDER — GADOBUTROL 1 MMOL/ML IV SOLN
7.2000 mL | Freq: Once | INTRAVENOUS | Status: AC | PRN
  Administered 2021-08-17: 7.2 mL via INTRAVENOUS

## 2022-05-12 ENCOUNTER — Ambulatory Visit (INDEPENDENT_AMBULATORY_CARE_PROVIDER_SITE_OTHER): Payer: Medicare Other | Admitting: Family Medicine

## 2022-05-12 ENCOUNTER — Encounter: Payer: Self-pay | Admitting: Family Medicine

## 2022-05-12 VITALS — BP 104/68 | HR 72 | Temp 97.5°F | Ht 67.0 in | Wt 154.6 lb

## 2022-05-12 DIAGNOSIS — H6123 Impacted cerumen, bilateral: Secondary | ICD-10-CM

## 2022-05-12 DIAGNOSIS — J22 Unspecified acute lower respiratory infection: Secondary | ICD-10-CM | POA: Diagnosis not present

## 2022-05-12 DIAGNOSIS — J45909 Unspecified asthma, uncomplicated: Secondary | ICD-10-CM | POA: Insufficient documentation

## 2022-05-12 DIAGNOSIS — J4520 Mild intermittent asthma, uncomplicated: Secondary | ICD-10-CM

## 2022-05-12 DIAGNOSIS — J040 Acute laryngitis: Secondary | ICD-10-CM

## 2022-05-12 MED ORDER — BENZONATATE 200 MG PO CAPS: 200.0000 mg | ORAL_CAPSULE | Freq: Two times a day (BID) | ORAL | 0 refills | Status: AC | PRN

## 2022-05-12 MED ORDER — DEBROX 6.5 % OT SOLN: 5.0000 [drp] | Freq: Two times a day (BID) | OTIC | 0 refills | Status: AC

## 2022-05-12 MED ORDER — PREDNISONE 10 MG PO TABS: 10.0000 mg | ORAL_TABLET | Freq: Every day | ORAL | 0 refills | Status: AC

## 2022-05-12 MED ORDER — AZITHROMYCIN 250 MG PO TABS: ORAL_TABLET | ORAL | 0 refills | Status: AC

## 2022-05-12 NOTE — Progress Notes (Signed)
Acute Office Visit  Subjective:     Patient ID: Denise Shannon, female    DOB: 1955/11/28, 66 y.o.   MRN: [id number]  Chief Complaint  Patient presents with   Cough    Cough, lots of mucus x 1 week fever x 3 days.     Cough Associated symptoms include a sore throat and wheezing. Pertinent negatives include no eye redness, myalgias or rash.   Patient is in today for evaluation and treatment of a 1 week history of URI signs and symptoms with nasal congestion postnasal drip sore throat, laryngitis.  Over the last few days she has developed acute fever has a productive cough.  There is tightness in her chest.  She has no history of asthma.  She has never smoked.  There is no smoking in the house.  Review of Systems  Constitutional: Negative.   HENT:  Positive for sore throat.   Eyes:  Negative for blurred vision, discharge and redness.  Respiratory:  Positive for cough, sputum production and wheezing.   Cardiovascular: Negative.   Gastrointestinal:  Negative for abdominal pain.  Genitourinary: Negative.   Musculoskeletal: Negative.  Negative for joint pain and myalgias.  Skin:  Negative for rash.  Neurological:  Negative for tingling, loss of consciousness and weakness.  Endo/Heme/Allergies:  Negative for polydipsia.        Objective:    BP 104/68   Pulse 72   Temp (!) 97.5 F (36.4 C) (Temporal)   Ht 5\' 7"  (1.702 m)   Wt 154 lb 9.6 oz (70.1 kg)   SpO2 96%   BMI 24.21 kg/m    Physical Exam Constitutional:      General: She is not in acute distress.    Appearance: Normal appearance. She is not ill-appearing, toxic-appearing or diaphoretic.  HENT:     Head: Normocephalic and atraumatic.     Right Ear: External ear normal.     Left Ear: External ear normal.     Ears:      Mouth/Throat:     Mouth: Mucous membranes are moist.     Pharynx: Oropharynx is clear. No oropharyngeal exudate or posterior oropharyngeal erythema.  Eyes:     General: No scleral icterus.        Right eye: No discharge.        Left eye: No discharge.     Extraocular Movements: Extraocular movements intact.     Conjunctiva/sclera: Conjunctivae normal.     Pupils: Pupils are equal, round, and reactive to light.  Cardiovascular:     Rate and Rhythm: Normal rate and regular rhythm.  Pulmonary:     Effort: Pulmonary effort is normal. No respiratory distress.     Breath sounds: Normal breath sounds.  Abdominal:     General: Bowel sounds are normal.     Tenderness: There is no abdominal tenderness. There is no guarding.  Musculoskeletal:     Cervical back: No rigidity or tenderness.  Skin:    General: Skin is warm and dry.  Neurological:     Mental Status: She is alert and oriented to person, place, and time.  Psychiatric:        Mood and Affect: Mood normal.        Behavior: Behavior normal.     No results found for any visits on 05/12/22.      Assessment & Plan:   Problem List Items Addressed This Visit       Respiratory   Lower respiratory  tract infection - Primary   Relevant Medications   azithromycin (ZITHROMAX) 250 MG tablet   benzonatate (TESSALON) 200 MG capsule   Laryngitis   Relevant Medications   predniSONE (DELTASONE) 10 MG tablet   Reactive airway disease   Relevant Medications   benzonatate (TESSALON) 200 MG capsule   predniSONE (DELTASONE) 10 MG tablet   Other Visit Diagnoses     Excessive cerumen in both ear canals       Relevant Medications   carbamide peroxide (DEBROX) 6.5 % OTIC solution       Meds ordered this encounter  Medications   azithromycin (ZITHROMAX) 250 MG tablet    Sig: Take 2 tablets on day 1, then 1 tablet daily on days 2 through 5    Dispense:  6 tablet    Refill:  0   benzonatate (TESSALON) 200 MG capsule    Sig: Take 1 capsule (200 mg total) by mouth 2 (two) times daily as needed for cough.    Dispense:  20 capsule    Refill:  0   predniSONE (DELTASONE) 10 MG tablet    Sig: Take 1 tablet (10 mg total) by mouth  daily with breakfast for 7 days.    Dispense:  7 tablet    Refill:  0   carbamide peroxide (DEBROX) 6.5 % OTIC solution    Sig: Place 5 drops into both ears 2 (two) times daily.    Dispense:  15 mL    Refill:  0    Return follow up with Dr. Dallas Schimke if not improving by next week.Mliss Sax, MD

## 2023-02-04 ENCOUNTER — Ambulatory Visit (INDEPENDENT_AMBULATORY_CARE_PROVIDER_SITE_OTHER): Payer: Medicare HMO | Admitting: Family Medicine

## 2023-02-04 VITALS — BP 104/80 | HR 64 | Temp 97.8°F | Ht 67.5 in | Wt 162.6 lb

## 2023-02-04 DIAGNOSIS — Z1231 Encounter for screening mammogram for malignant neoplasm of breast: Secondary | ICD-10-CM

## 2023-02-04 DIAGNOSIS — B372 Candidiasis of skin and nail: Secondary | ICD-10-CM

## 2023-02-04 MED ORDER — NYSTATIN 100000 UNIT/GM EX POWD: 1.0000 | Freq: Three times a day (TID) | CUTANEOUS | 1 refills | Status: AC

## 2023-02-04 NOTE — Progress Notes (Signed)
[facility] [address], [address] [place], [place] [address] [phone]  Date:  02/04/2023   Name:  Denise Shannon   DOB:  Jul 31, 1955   MRN:  [id number]  PCP:  Pearline Cables, MD    Chief Complaint: pt would like to have a mammogram (Sister was Dx with Breast cancer and Mets to the brain. )   History of Present Illness:  Denise Shannon is a 67 y.o. very pleasant female patient who presents with the following:  Pt seen today with concern of -would like to have a mammogram  Last seen by myself 2 years ago for her CPE She tends to decline some recommended health maintenance  Pap- 6/22, negative  Mammo- none on chart, this has never been done Colon- I ordered cologuard for her 2 years ago but I don't think this was completed -I offered to order this again or send her for colonoscopies, she declines for now Labs done 6/22-declines blood work today  She notes her oldest sister died from metastatic breast cancer not too long ago, patient would like to have a mammogram.  Patient Active Problem List   Diagnosis Date Noted   Lower respiratory tract infection 05/12/2022   Laryngitis 05/12/2022   Reactive airway disease 05/12/2022    Past Medical History:  Diagnosis Date   Chicken pox     Past Surgical History:  Procedure Laterality Date   FRACTURE SURGERY Left    hand    Social History   Tobacco Use   Smoking status: Never   Smokeless tobacco: Never  Substance Use Topics   Alcohol use: No    Family History  Problem Relation Age of Onset   Lung cancer Mother    Prostate cancer Father    Stroke Mother     No Known Allergies  Medication list has been reviewed and updated.  Current Outpatient Medications on File Prior to Visit  Medication Sig Dispense Refill   aspirin 325 MG tablet Take 325 mg by mouth as needed. Reported on 01/07/2016     benzonatate (TESSALON) 200 MG capsule Take 1 capsule (200 mg total) by  mouth 2 (two) times daily as needed for cough. 20 capsule 0   carbamide peroxide (DEBROX) 6.5 % OTIC solution Place 5 drops into both ears 2 (two) times daily. 15 mL 0   No current facility-administered medications on file prior to visit.    Review of Systems:  As per HPI- otherwise negative.   Physical Examination: Vitals:   02/04/23 1451  BP: 104/80  Pulse: 64  Temp: 97.8 F (36.6 C)  SpO2: 98%   Vitals:   02/04/23 1451  Weight: 162 lb 9.6 oz (73.8 kg)  Height: 5' 7.5" (1.715 m)   Body mass index is 25.09 kg/m. Ideal Body Weight: Weight in (lb) to have BMI = 25: 161.7  GEN: no acute distress.  Normal weight, looks well HEENT: Atraumatic, Normocephalic.  Ears and Nose: No external deformity. CV: RRR, No M/G/R. No JVD. No thrill. No extra heart sounds. PULM: CTA B, no wheezes, crackles, rhonchi. No retractions. No resp. distress. No accessory muscle use. ABD: S, NT, ND EXTR: No c/c/e PSYCH: Normally interactive. Conversant.  Patient also notes that over the summer months the skin under her breasts has become irritated and red  BP Readings from Last 3 Encounters:  02/04/23 104/80  05/12/22 104/68  08/12/21 (!) 95/47    Assessment  and Plan: Encounter for screening mammogram for malignant neoplasm of breast - Plan: MM 3D SCREENING MAMMOGRAM BILATERAL BREAST  Candidal intertrigo - Plan: nystatin (MYCOSTATIN/NYSTOP) powder  Ordered mammogram Discussed conservative management for Candida intertrigo, prescribed nystatin powder    Signed Abbe Amsterdam, MD

## 2023-02-04 NOTE — Patient Instructions (Signed)
Good to see you today- I ordered your mammogram Please stop by imaging on the ground floor and schedule your mammogram asap Try the powder medication under your breasts for the itchy area- can use up three times a day as needed  Try to keep this area nice and dry- change clothes if you get too sweaty!

## 2023-02-10 ENCOUNTER — Encounter (HOSPITAL_BASED_OUTPATIENT_CLINIC_OR_DEPARTMENT_OTHER): Payer: Self-pay

## 2023-02-10 ENCOUNTER — Ambulatory Visit (HOSPITAL_BASED_OUTPATIENT_CLINIC_OR_DEPARTMENT_OTHER)
Admission: RE | Admit: 2023-02-10 | Discharge: 2023-02-10 | Disposition: A | Discharge status: Home / Self Care | Payer: Medicare HMO | Source: Ambulatory Visit | Attending: Family Medicine | Admitting: Family Medicine

## 2023-02-10 DIAGNOSIS — Z1231 Encounter for screening mammogram for malignant neoplasm of breast: Secondary | ICD-10-CM | POA: Diagnosis not present

## 2023-10-26 ENCOUNTER — Telehealth: Payer: Self-pay | Admitting: Family Medicine

## 2023-10-26 NOTE — Telephone Encounter (Signed)
 Copied from CRM [id number]. Topic: Medicare AWV >> Oct 26, 2023 11:02 AM Juliana Ocean wrote: Reason for CRM: Called 10/26/2023 to sched AWV - NO VOICEMAIL  Rosalee Collins; Care Guide Ambulatory Clinical Support [facility] Direct Dial: [phone]

## 2023-12-17 IMAGING — MR MR LUMBAR SPINE WO/W CM
4 of 8 series · 23 of 48 positions shown · IV contrast (gadavist)
Comparison: Ultrasound 08/13/2021

CLINICAL DATA: Soft tissue mass just to the right of the lower
spine noted over the last 8 weeks.

EXAM:
MRI LUMBAR SPINE WITHOUT AND WITH CONTRAST
TECHNIQUE: Multiplanar and multiecho pulse sequences of the lumbar spine were
obtained without and with intravenous contrast.
CONTRAST:  7.2mL GADAVIST GADOBUTROL 1 MMOL/ML IV SOLN

[Series 4: T1 · sagittal · 5.0mm · 0.81mm/px · 4 of 20 slices shown (1 of 2)]
[im 1/20]
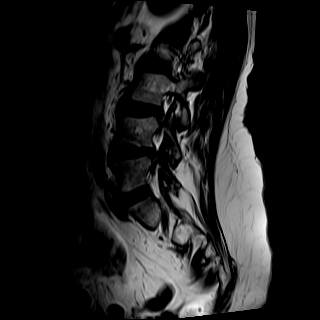
[im 7/20]
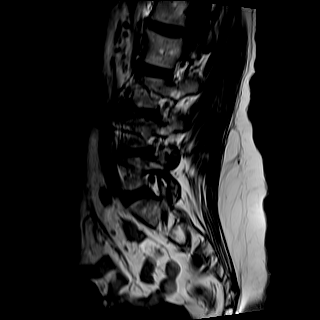
[im 13/20]
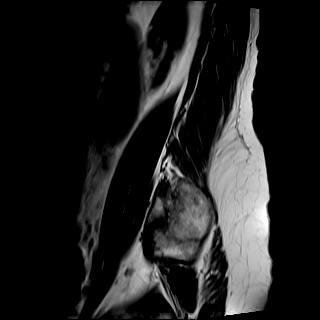
[im 20/20]
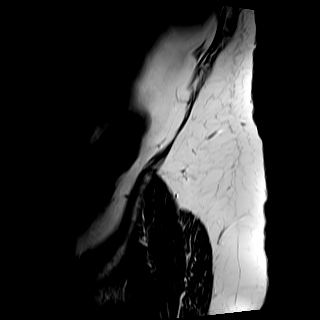

[Series 6: T2 · axial · 4.0mm · 0.39mm/px · z∈[-75,+119]mm · 9 of 40 slices shown (1 of 2)]
[im 1/40]
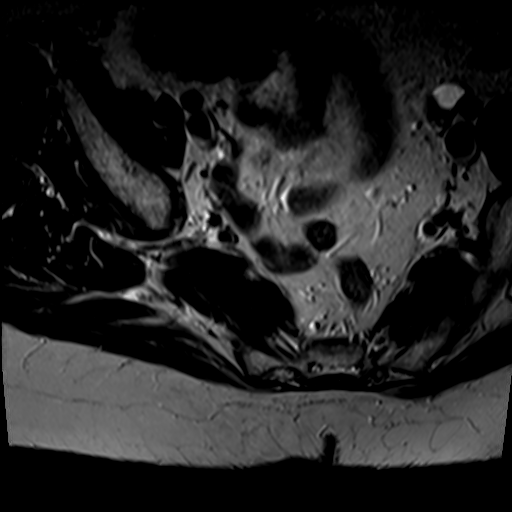
[im 5/40]
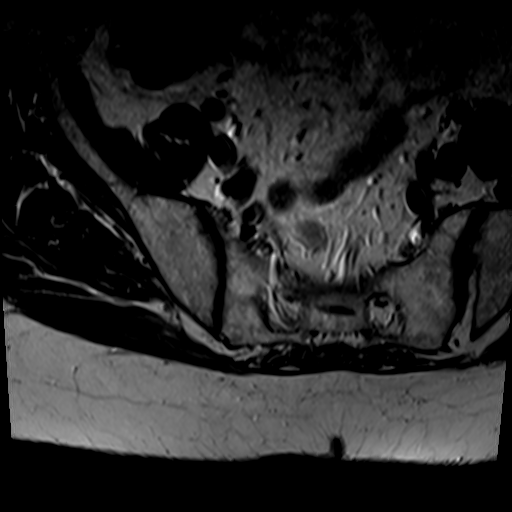
[im 10/40]
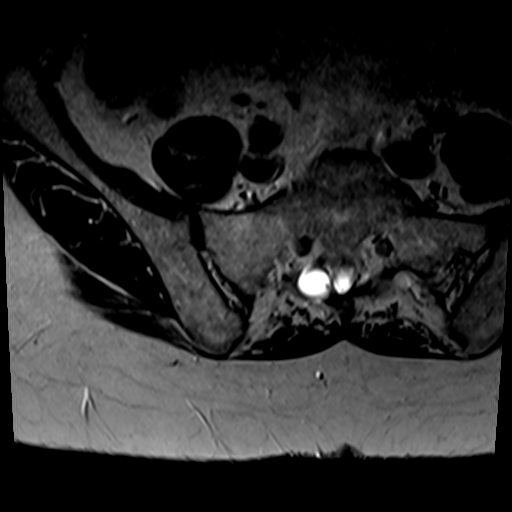
[im 15/40]
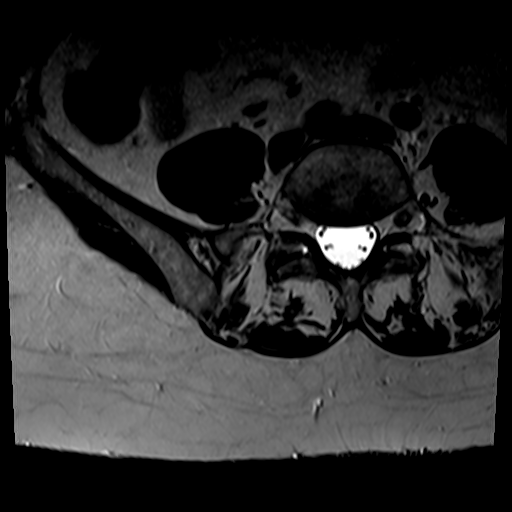
[im 20/40]
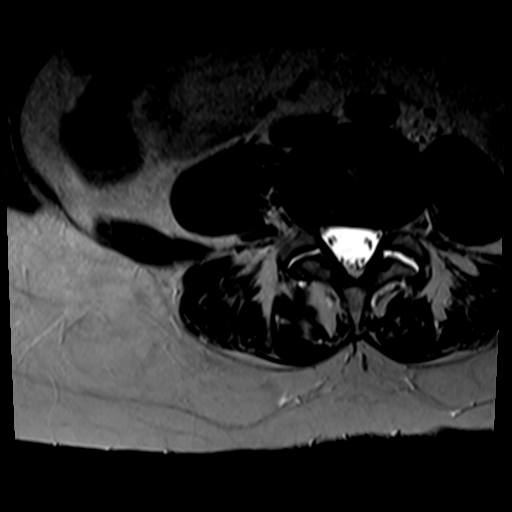
[im 25/40]
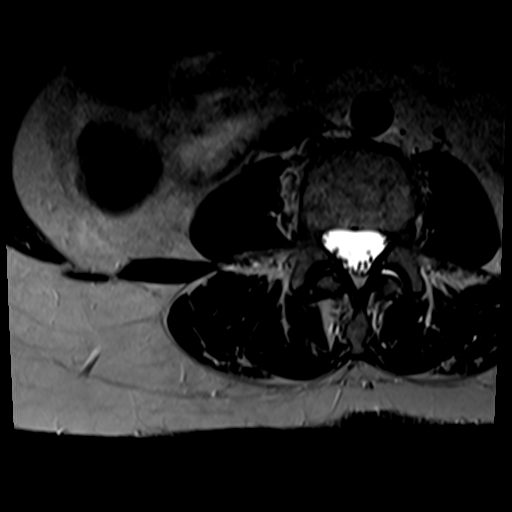
[im 30/40]
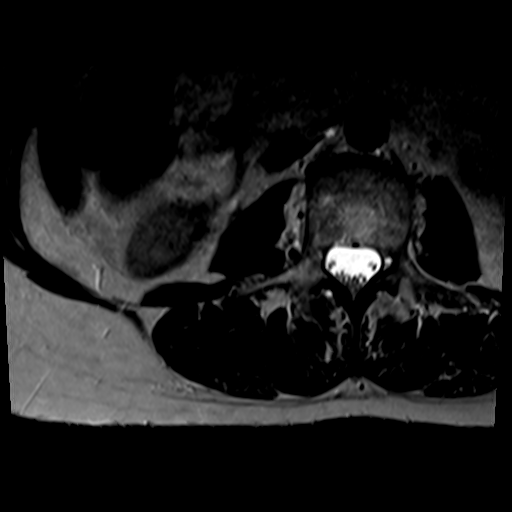
[im 35/40]
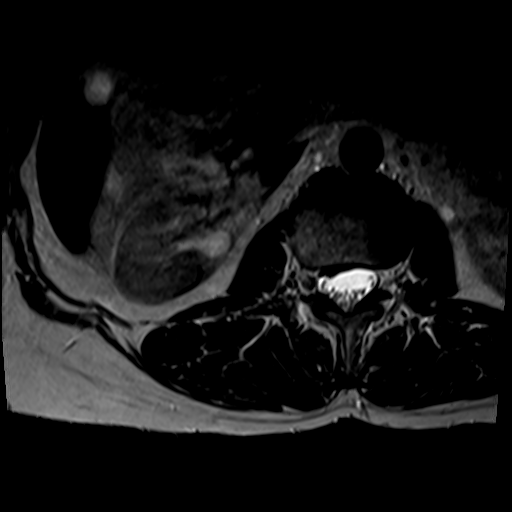
[im 40/40]
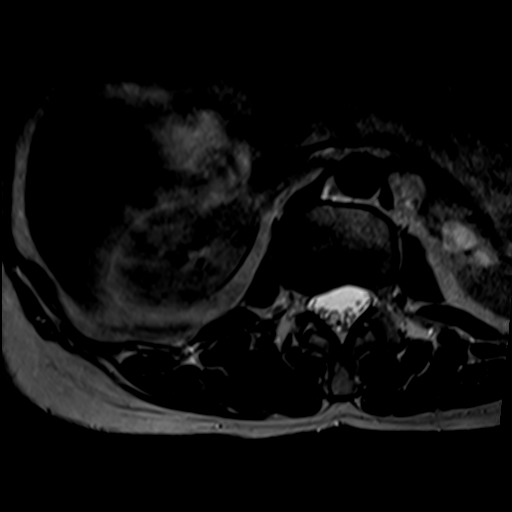

[Series 7: T1 · axial · 4.0mm · 0.39mm/px · z∈[-75,+94]mm · 6 of 40 slices shown (2 of 2)]
[im 1/40]
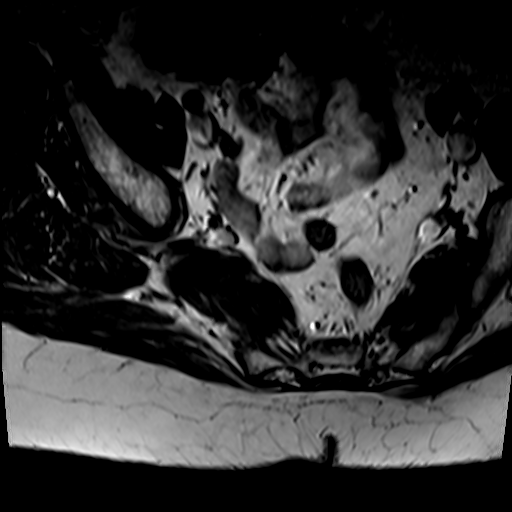
[im 5/40]
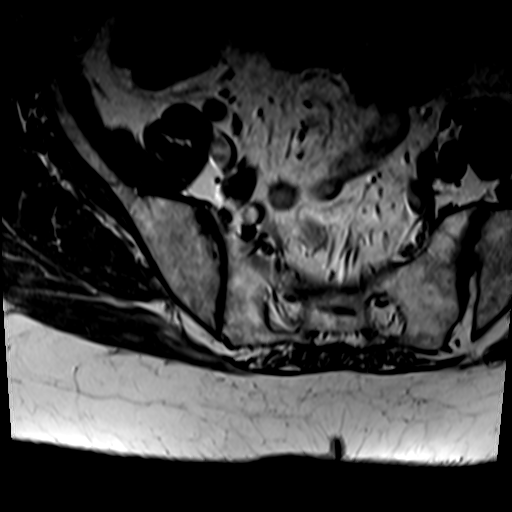
[im 10/40]
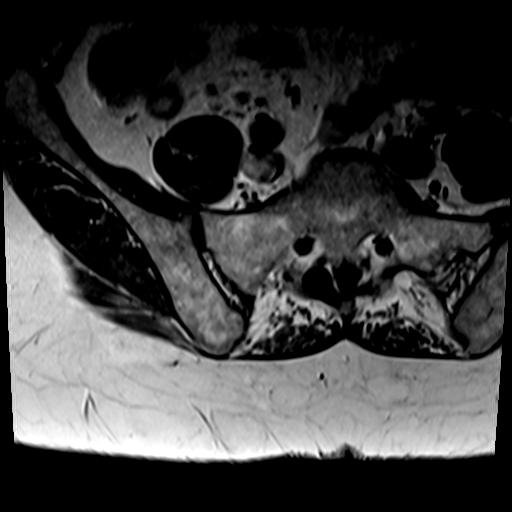
[im 15/40]
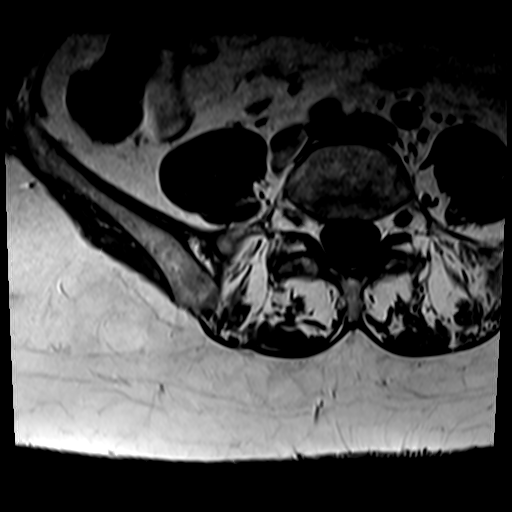
[im 20/40]
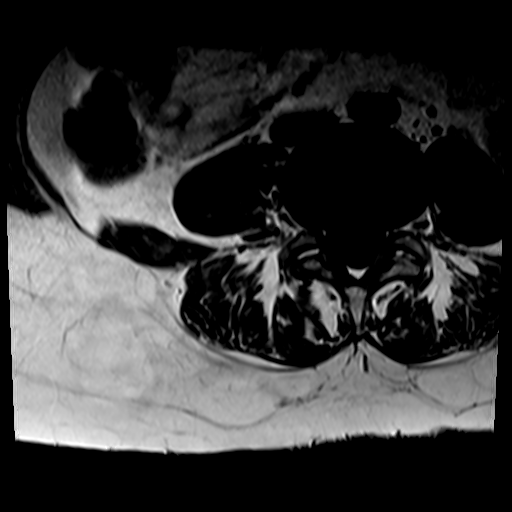
[im 35/40]
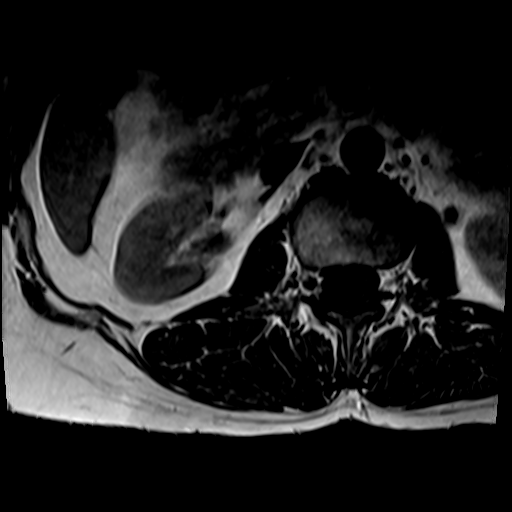

[Series 8: T2 · sagittal · 5.0mm · 0.81mm/px · 4 of 20 slices shown (2 of 2)]
[im 1/20]
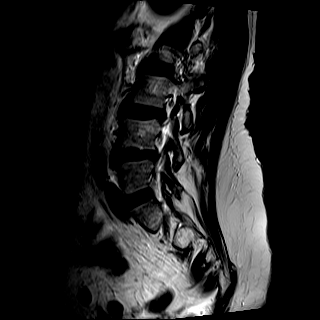
[im 7/20]
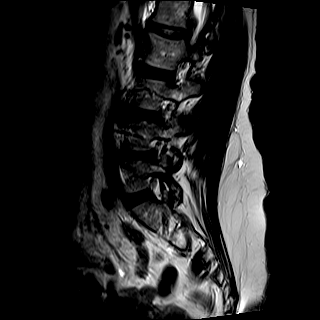
[im 13/20]
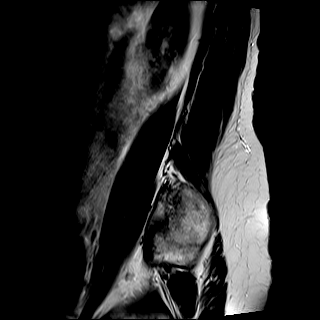
[im 20/20]
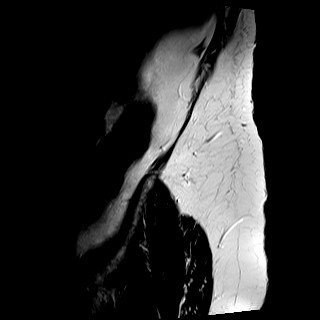

[23 of 48 positions shown; findings below may reference images not displayed]

FINDINGS: Segmentation:  5 lumbar type vertebral bodies.

Alignment: Scoliotic curvature convex to the left with the apex at
L3-4.

Vertebrae: No significant bone finding. Benign appearing hemangioma
within the left side of the L3 vertebral body. Discogenic endplate
changes of the marrow at L4-5 without edematous change.

Conus medullaris and cauda equina: Conus extends to the L1 level.
Conus and cauda equina appear normal.

Paraspinal and other soft tissues: There is asymmetric fat in the
posterior subcutaneous tissues to the right of midline in the lower
lumbar spine region. There is no think concerning about this
appearance by MRI. The fat does not show edema or soft tissue
components. This could be due to lipoma or simply lobular asymmetric
fat.

Disc levels:

L1-2: Noncompressive disc bulge.

L2-3: Noncompressive disc bulge.

L3-4: Noncompressive disc bulge.

L4-5: Noncompressive disc bulge.

L5-S1: Normal disc space.
IMPRESSION: In the area of concern to the right of midline in the lower lumbar
region, there is some asymmetric fat with a slightly lobular
appearance. This could indicate lipoma or simply lobular asymmetric
fat. There is no evidence of edema or soft tissue components to
suggest aggressive or worrisome pathology.

Spinal curvature convex to the left. Mild non-compressive disc
bulges L1-2 through L4-5.

## 2024-02-04 ENCOUNTER — Other Ambulatory Visit: Payer: Self-pay

## 2024-02-04 DIAGNOSIS — Z1211 Encounter for screening for malignant neoplasm of colon: Secondary | ICD-10-CM

## 2024-04-05 ENCOUNTER — Ambulatory Visit: Payer: Self-pay

## 2024-04-05 NOTE — Telephone Encounter (Signed)
 FYI Only or Action Required?: FYI only for provider.  Patient was last seen in primary care on 02/04/2023 by Copland, Harlene BROCKS, MD.  Called Nurse Triage reporting Back Pain.  Symptoms began several months ago.  Interventions attempted: Nothing.  Symptoms are: gradually worsening.  Triage Disposition: See PCP When Office is Open (Within 3 Days)  Patient/caregiver understands and will follow disposition?: Yes      Copied from CRM #[id number]. Topic: Clinical - Red Word Triage >> Apr 05, 2024 11:22 AM Jasmin G wrote: [facility] that prompted transfer to Nurse Triage: Pt's husband initially called to scheduled an appt for pt due to having a  small bump at the base of her spine that causes her to have back pain. Reason for Disposition  [1] MODERATE back pain (e.g., interferes with normal activities) AND [2] present > 3 days  Answer Assessment - Initial Assessment Questions This RN spoke with the patient's husband regarding symptoms. He states the patient states she has a bump on the area and is having increasing pain. Patient requesting to be seen at an earlier time due to symptoms and requested a female provider.   1. ONSET: When did the pain begin? (e.g., minutes, hours, days)     Last year  2. LOCATION: Where does it hurt? (upper, mid or lower back)     On spine  3. SEVERITY: How bad is the pain?  (e.g., Scale 1-10; mild, moderate, or severe)     Moderate  4. PATTERN: Is the pain constant? (e.g., yes, no; constant, intermittent)      Comes and goes  5. RADIATION: Does the pain shoot into your legs or somewhere else?     Denies  6. CAUSE:  What do you think is causing the back pain?      Bump on the area  8. MEDICINES: What have you taken so far for the pain? (e.g., nothing, acetaminophen , NSAIDS)     Denies  9. NEUROLOGIC SYMPTOMS: Do you have any weakness, numbness, or problems with bowel/bladder control?     Denies  10. OTHER SYMPTOMS: Do you have any other  symptoms? (e.g., fever, abdomen pain, burning with urination, blood in urine)       Denies  Protocols used: Back Pain-A-AH

## 2024-04-07 ENCOUNTER — Ambulatory Visit: Admitting: Family Medicine

## 2024-04-07 ENCOUNTER — Encounter: Payer: Self-pay | Admitting: Family Medicine

## 2024-04-07 VITALS — BP 104/44 | HR 65 | Ht 67.5 in | Wt 154.0 lb

## 2024-04-07 DIAGNOSIS — G8929 Other chronic pain: Secondary | ICD-10-CM

## 2024-04-07 DIAGNOSIS — M545 Low back pain, unspecified: Secondary | ICD-10-CM

## 2024-04-07 NOTE — Progress Notes (Signed)
 Acute Office Visit  Subjective:     Patient ID: Denise Shannon, female    DOB: [birth year], 68 y.o.   MRN: [id number]  Chief Complaint  Patient presents with   Back Pain    Back Pain   Patient is in today for back pain.  Discussed the use of AI scribe software for clinical note transcription with the patient, who gave verbal consent to proceed.  History of Present Illness Denise Shannon is a 68 year old female who presents with right-sided back pain.  She describes a sensation of a 'little lump' on the right side of her back, which she characterizes as 'a little bumpy' and 'a little sore during the day.' The discomfort has been present for about a year and has become slightly more noticeable over time. The soreness is primarily felt when the area is touched or when she leans against something, but it does not bother her otherwise.  No shooting pain or radiation of the discomfort to other areas, such as down her leg. She is able to perform movements such as bending forward, twisting, and leaning without exacerbating the pain.  An MRI conducted in 202 showed a small fatty area in the right of midline lower lumbar region.   No issues with balance or feeling unsteady on her feet.        All review of systems negative except what is listed in the HPI      Objective:    BP (!) 104/44   Pulse 65   Ht 5' 7.5 (1.715 m)   Wt 154 lb (69.9 kg)   SpO2 99%   BMI 23.76 kg/m    Physical Exam Vitals reviewed.  Constitutional:      Appearance: Normal appearance.  HENT:     Head: Normocephalic and atraumatic.  Musculoskeletal:        General: Normal range of motion.     Lumbar back: No swelling, signs of trauma, spasms or bony tenderness. Normal range of motion.     Comments: Approximately 2cm round mass located deep in right lower back, firm, mobile, not fixed or matted (previous MRI suggested Lipoma)  Skin:    General: Skin is warm.     Findings: No bruising, erythema or  rash.  Neurological:     General: No focal deficit present.     Mental Status: She is alert and oriented to person, place, and time. Mental status is at baseline.  Psychiatric:        Mood and Affect: Mood normal.        Behavior: Behavior normal.        Thought Content: Thought content normal.        Judgment: Judgment normal.          No results found for any visits on 04/07/24.      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 No concerning features on imaging (MRI 2023 suggested lipoma, no pathological findings) - Advised use of heating pad 2-3 times daily for 10-15 minutes with a barrier. - Suggested occasional ibuprofen  or Tylenol  - Provided printed instructions for back stretches.   Patient aware of signs/symptoms  requiring further/urgent evaluation.   No orders of the defined types were placed in this encounter.   Return if symptoms worsen or fail to improve, for - due for PCP follow-up.  Waddell KATHEE Mon, NP

## 2024-07-05 ENCOUNTER — Ambulatory Visit: Admitting: Family Medicine

## 2024-07-05 DIAGNOSIS — J329 Chronic sinusitis, unspecified: Secondary | ICD-10-CM

## 2024-07-05 NOTE — Progress Notes (Signed)
" ° °  Established Patient Office Visit  Subjective   Patient ID: Denise Shannon, female    DOB: 1955/11/09  Age: 69 y.o. MRN: [id number]  No chief complaint on file.   HPI  No show  ROS    Objective:     There were no vitals taken for this visit.   Physical Exam   No results found for any visits on 07/05/24.    The ASCVD Risk score (Arnett DK, et al., 2019) failed to calculate for the following reasons:   Cannot find a previous HDL lab   Cannot find a previous total cholesterol lab   * - Cholesterol units were assumed    Assessment & Plan:   Problem List Items Addressed This Visit   None Visit Diagnoses       Sinusitis, unspecified chronicity, unspecified location    -  Primary     Pt no showed   No follow-ups on file.    Aynsley Fleet R Lowne Chase, DO No show "
